# Patient Record
Sex: Female | Born: 1995 | Hispanic: Refuse to answer | Marital: Single | Smoking: Never smoker
Health system: Southern US, Community
[De-identification: ages and names within clinical notes are randomized; demographics above are authoritative.]

---
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
## 2016-02-10 ENCOUNTER — Encounter: Payer: Self-pay | Admitting: Family Medicine

## 2016-02-10 ENCOUNTER — Ambulatory Visit (INDEPENDENT_AMBULATORY_CARE_PROVIDER_SITE_OTHER): Payer: PRIVATE HEALTH INSURANCE | Admitting: Family Medicine

## 2016-02-10 DIAGNOSIS — M545 Low back pain, unspecified: Secondary | ICD-10-CM

## 2016-02-10 DIAGNOSIS — M79604 Pain in right leg: Secondary | ICD-10-CM

## 2016-02-10 MED ORDER — CYCLOBENZAPRINE HCL 5 MG PO TABS: 5.0000 mg | ORAL_TABLET | Freq: Every day | ORAL | 0 refills | Status: AC

## 2016-02-10 NOTE — Progress Notes (Signed)
Patient presents today for follow-up regarding her lower back pain. Patient did see an orthopedic physician when she was home in [place] over the summer. Besides the facet hypertrophy the physician at home commented on a disc bulge that was probably causing her symptoms with the right sided back pain with radiculopathy. She also admits to having a bone scan which was negative. She had 2 epidural injections and did PT while she was home. She did not have much relief with the epidural injections she received. She did see Dr. Ardine Engiehl when she returned back to school and has been seeing the physical therapist that comes to campus. She still admits to having right sided back pain mostly with sitting and radicular symptoms that go down to her right foot but this happens only with impact running for longer than 2-1/2 miles. She does not have the radicular symptoms with ADLs. She currently is only taking Motrin for her pain. She runs about every other day on grass and the track and does back rehabilitation and cross training on the other days. She has not done any lacrosse activity. She states that she still has discomfort with side-to-side movement and rotation that is required with Lacrosse activity. She denies any foot drop, incontinence, fever/chills.  ROS: Negative except mentioned above.  Vitals as per Epic.  GENERAL: NAD RESP: CTA B CARD: RRR MSK: mild right lower lumbar paravertebral tenderness, FROM, -SLR, good hamstring flexibility, slightly decreased strength of RLE compared to LLE, weak glutes, nv intact   NEURO: CN II-XII grossly intact   A/P: R Lower Back Pain with Radicular Symptoms - continue current course of rehabilitation, will prescribe patient Flexeril for nighttime use if needed, patient will continue to take Motrin or Aleve when necessary during the daytime. Will follow up with me in one month for interval change, sooner if any new problems.

## 2016-03-21 ENCOUNTER — Other Ambulatory Visit: Payer: Self-pay | Admitting: Family Medicine

## 2016-03-27 ENCOUNTER — Encounter: Payer: Self-pay | Admitting: Family Medicine

## 2016-03-27 ENCOUNTER — Ambulatory Visit (INDEPENDENT_AMBULATORY_CARE_PROVIDER_SITE_OTHER): Payer: PRIVATE HEALTH INSURANCE | Admitting: Family Medicine

## 2016-03-27 DIAGNOSIS — M5416 Radiculopathy, lumbar region: Secondary | ICD-10-CM

## 2016-03-27 DIAGNOSIS — M5417 Radiculopathy, lumbosacral region: Secondary | ICD-10-CM

## 2016-03-30 ENCOUNTER — Other Ambulatory Visit: Payer: Self-pay | Admitting: Family Medicine

## 2016-03-30 DIAGNOSIS — M5410 Radiculopathy, site unspecified: Secondary | ICD-10-CM

## 2016-05-15 NOTE — Progress Notes (Signed)
Patient presents today for follow-up regarding her lower back pain and right lower extremity radicular pain. Patient states that her symptoms have improved have not resolved. She states that she is able to exercise however certain motions such as twisting and sprinting do cause discomfort. She is able to run approximately 2 miles without having any discomfort. She denies any foot drop, incontinence, fever/chills. She has been working with her Event organiserathletic trainer on core strengthening and hamstring flexibility and has also seen PT. She did have injections over the summer when she was home in [place]. I'm still unclear as to whether she had epidural injections or facet injections. I have not seen paperwork related to these procedures. She has had imaging which has not shown any significant disc pathology. She has an order today from a provider back home for an EMG. She has seen Dr. Ardine Engiehl as well who suggested to continue rehabilitation for now. She has not attempted to do any Lacrosse activity at this point.  ROS: Negative except mentioned above. Vitals as per Epic.  GENERAL: NAD RESP: CTA B CARD: RRR MSK: No midline tenderness of the back, mild generalized paravertebral tenderness of the lower lumbar area, reproduced with extension, normal flexion, full range of motion of the back, positive straight leg raise, negative foot drop, normal heel and toe walk, 5 out of 5 strength of lower extremities, NV intact NEURO: CN II-XII grossly intact   A/P: Lower back pain with radicular symptoms - unsure as to the etiology given imaging and unsuccessful treatment that she has had previously. I would like her to see what limitation she has with practicing with Lacrosse. Patient is okay to attempt this and will be monitored by the athletic trainer. If any symptoms persist or worsen she is to stop the attempt at practicing. Modification in the weight room as she has been doing. Unsure whether EMG will be of any help with  diagnosis but will proceed and getting this procedure. Will also speak with Dr. Ardine Engiehl about [facility]. Patient is fine with the plan and all questions were answered.

## 2016-08-16 ENCOUNTER — Ambulatory Visit (INDEPENDENT_AMBULATORY_CARE_PROVIDER_SITE_OTHER): Payer: PRIVATE HEALTH INSURANCE | Admitting: Family Medicine

## 2016-08-16 ENCOUNTER — Encounter: Payer: Self-pay | Admitting: Family Medicine

## 2016-08-16 VITALS — BP 106/61 | HR 71 | Temp 98.2°F | Resp 14

## 2016-08-16 DIAGNOSIS — R1084 Generalized abdominal pain: Secondary | ICD-10-CM

## 2016-08-16 NOTE — Progress Notes (Signed)
Patient presents today with generalized abdominal pain. She denies any injury or trauma to the area. She denies any diarrhea, vomiting or fever. She has no upper respiratory symptoms. She has had symptoms for the last few days intermittently. She recently finished her menstrual cycle. She denies any vaginal discharge or any urinary symptoms. She does admit that she has hard stool and usually has a bowel movement only every for 5 days. She states that this has been going on for a long time. She denies any melena or bright red blood per rectum. She denies any family history of inflammatory bowel disease. She denies any known history of gluten sensitivity. She states her diet is mostly bland and healthy. She denies any excessive caffeine. At times she does state that she has reflux symptoms. She denies eating or drinking much dairy. She does take a fiber supplement. She admits to usually eating salads and wheat bread.   ROS: Negative except mentioned above. Vitals as per Epic.  GENERAL: NAD HEENT: no pharyngeal erythema, no exudate, no erythema of TMs, no cervical LAD RESP: CTA B CARD: RRR ABD: Mildly decreased bowel sounds, mild discomfort right lower quadrant, epigastric, and left lower quadrant, no rebound or guarding, no flank tenderness, no suprapubic tenderness NEURO: CN II-XII grossly intact   Urine Dip : Negative leukocytes, negative nitrites, 1+ protein, negative blood, trace ketones, negative glucose, specific gravity 1.005, pH 8.0, negative urine pregnancy  A/P: Generalized abdominal pain - no acute pain at this time, vitals stable, discussed with patient that her symptoms sound suggestive of constipation, would suggest using magnesium citrate once or twice only and then starting to take a stool softener daily, increase water intake,  start eating 2-3 prunes a day. If symptoms do persist or worsen seek medical attention. Patient addresses understanding.

## 2017-09-03 IMAGING — CT CT L SPINE W/O CM
3 of 5 series · 14 of 33 positions shown, 17 images · non-contrast
Comparison: Lumbar MRI 09/13/2015. Lumbar spine radiographs
08/30/2015.

CLINICAL DATA: 20-year-old female lacrosse player with persistent
back pain not responding to conservative management. Lumbar back
pain made worse with extension motion. Radicular symptoms somewhat
greater on the left side. Subsequent encounter.

EXAM:
CT LUMBAR SPINE WITHOUT CONTRAST - LIMITED
TECHNIQUE: Multidetector CT imaging of the lumbar spine was performed without
intravenous contrast administration. Multiplanar CT image
reconstructions were also generated.

[Series 4: l spine soft · axial · 0.22mm/px · z∈[-893,-823]mm · 6 of 46 slices shown, 8 images]
[im 6/46  soft-tissue]
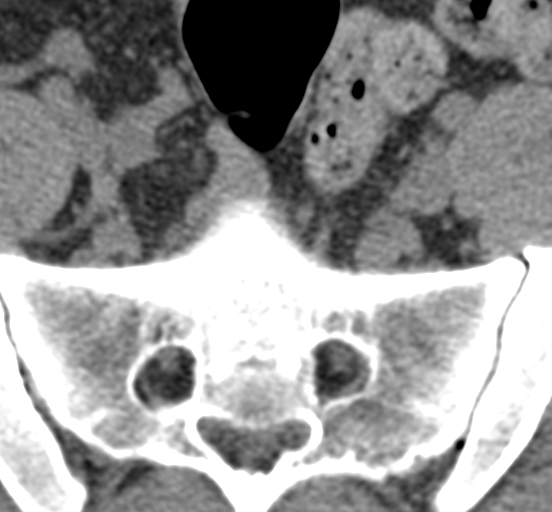
[im 6/46  bone]
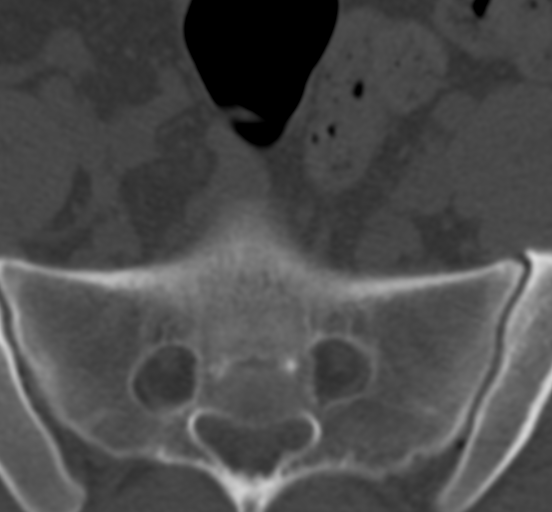
[im 16/46  bone]
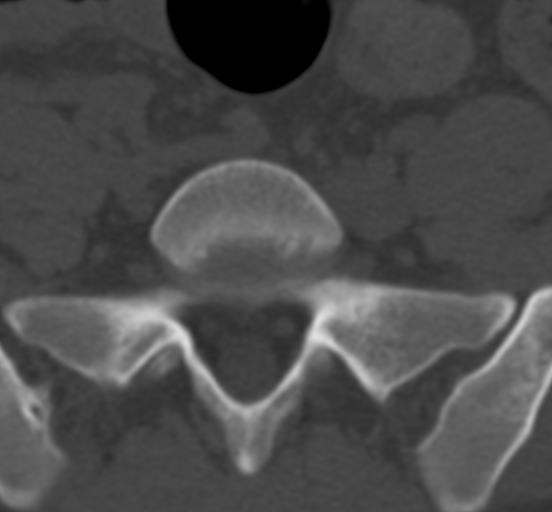
[im 21/46  bone]
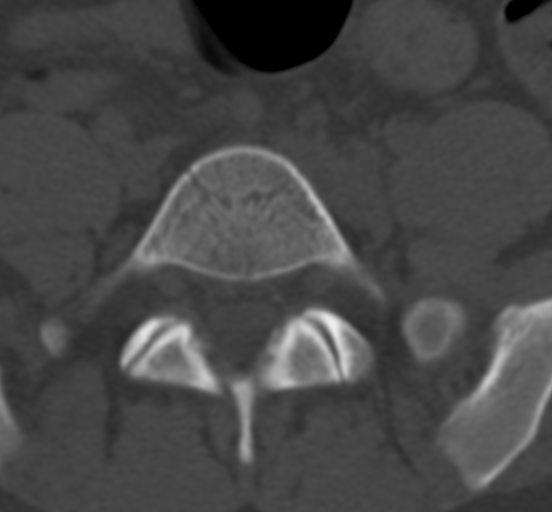
[im 26/46  bone]
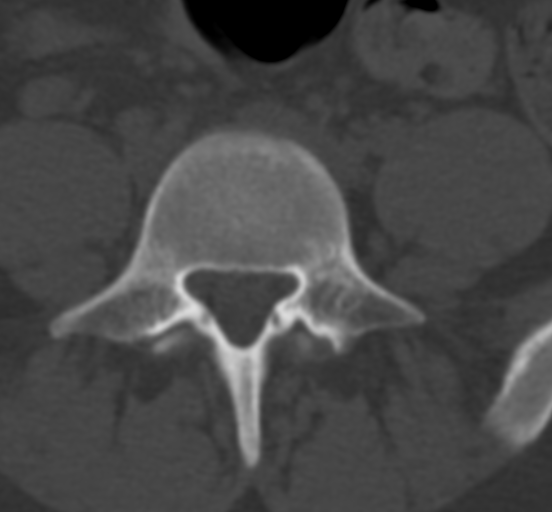
[im 36/46  soft-tissue]
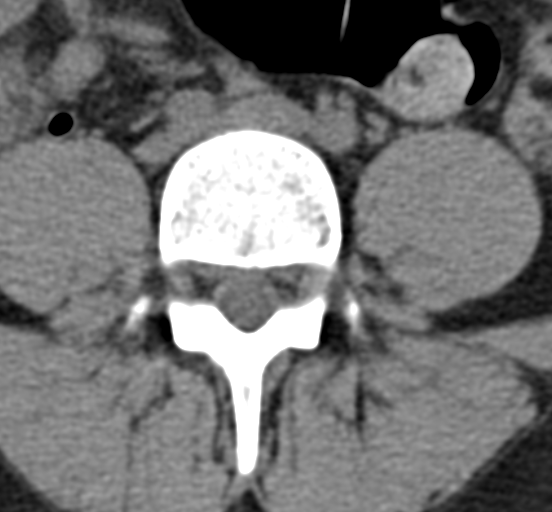
[im 36/46  bone]
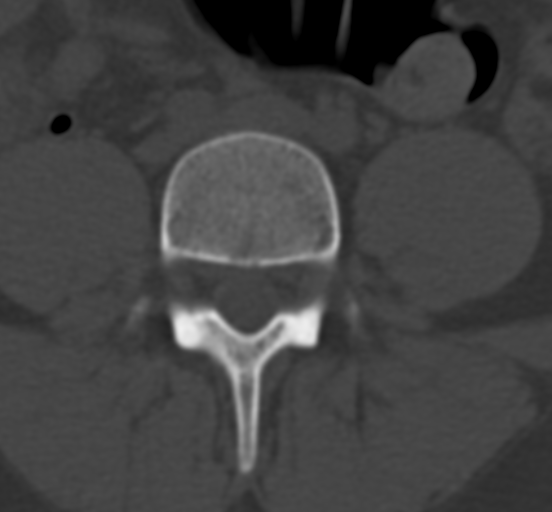
[im 41/46  bone]
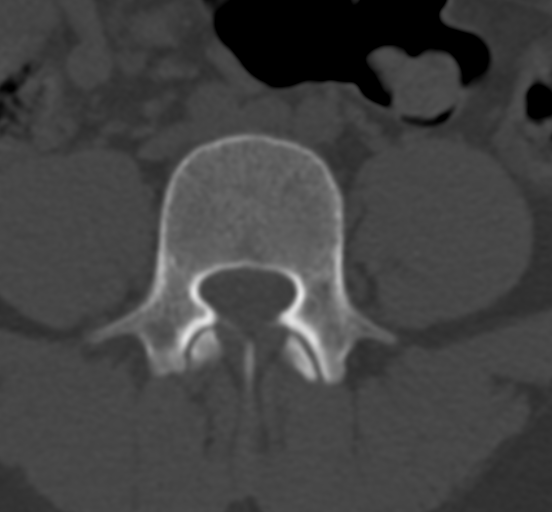

[Series 8: sagittal soft tissue · sagittal · 0.23mm/px · 5 of 62 slices shown, 6 images]
[im 21/62  bone]
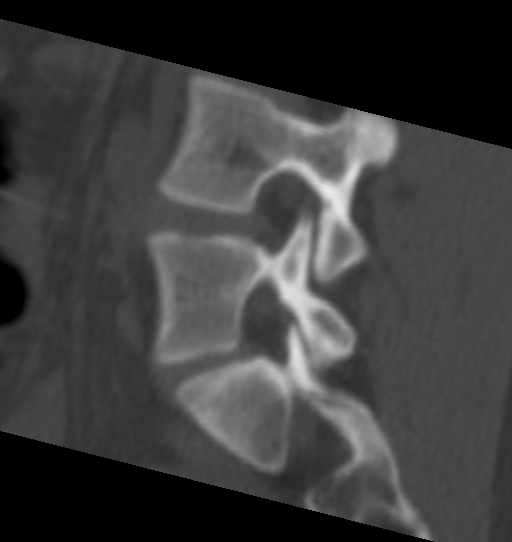
[im 26/62  bone]
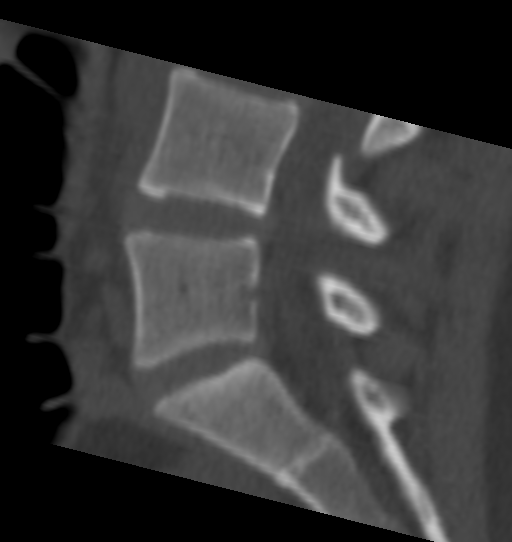
[im 31/62  soft-tissue]
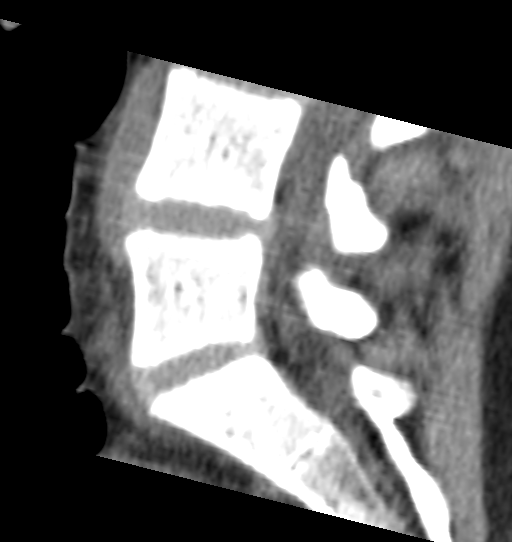
[im 31/62  bone]
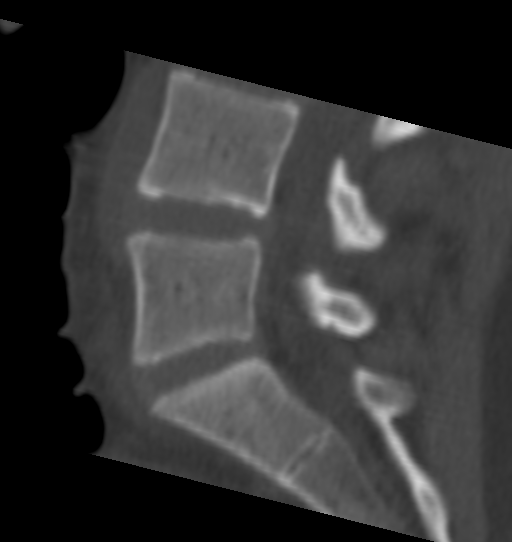
[im 36/62  bone]
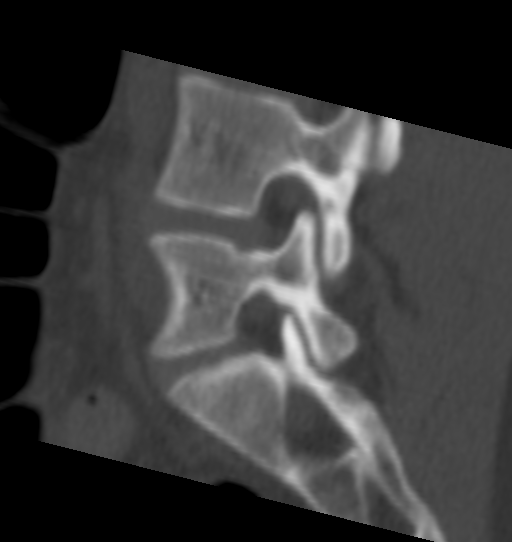
[im 41/62  bone]
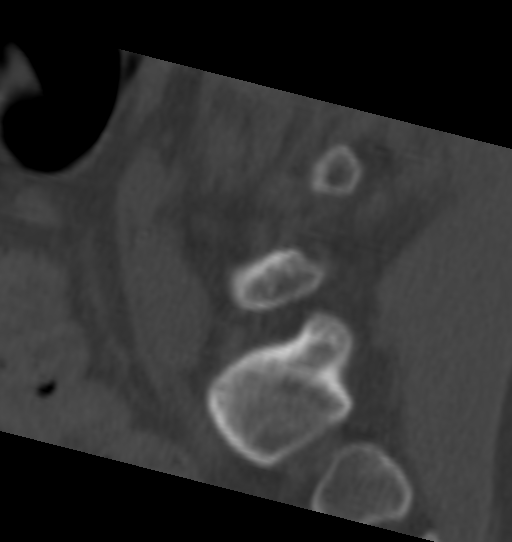

[Series 9: coronal soft tissue · coronal · 0.21mm/px · 3 of 57 slices shown]
[im 12/57  bone]
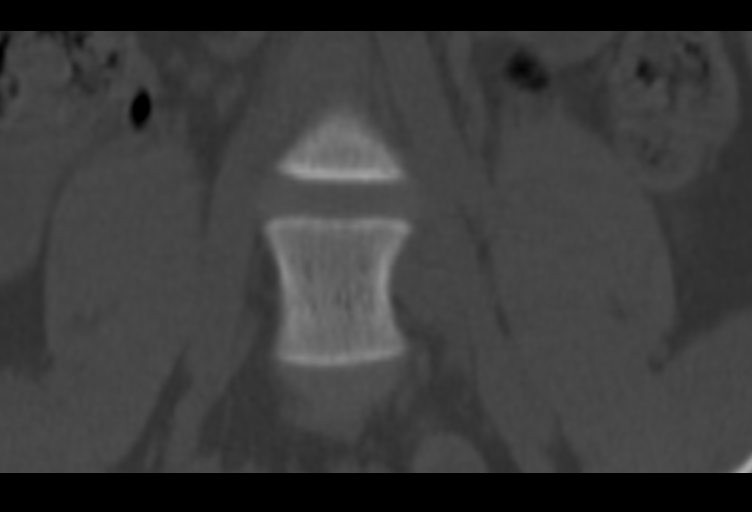
[im 23/57  bone]
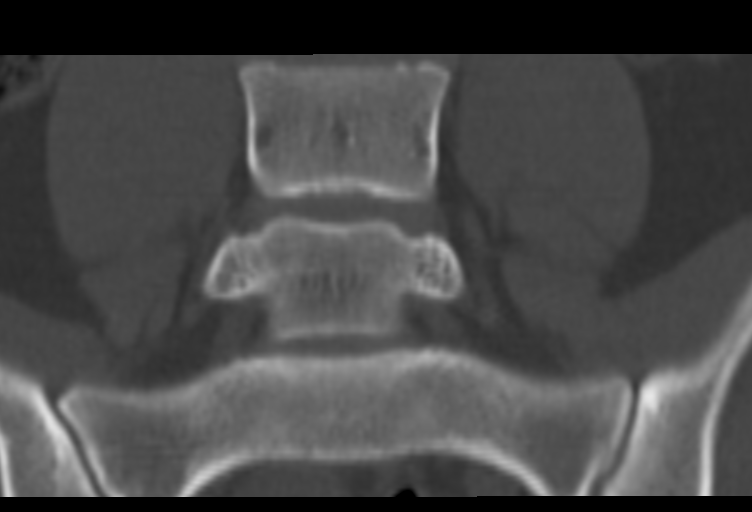
[im 34/57  bone]
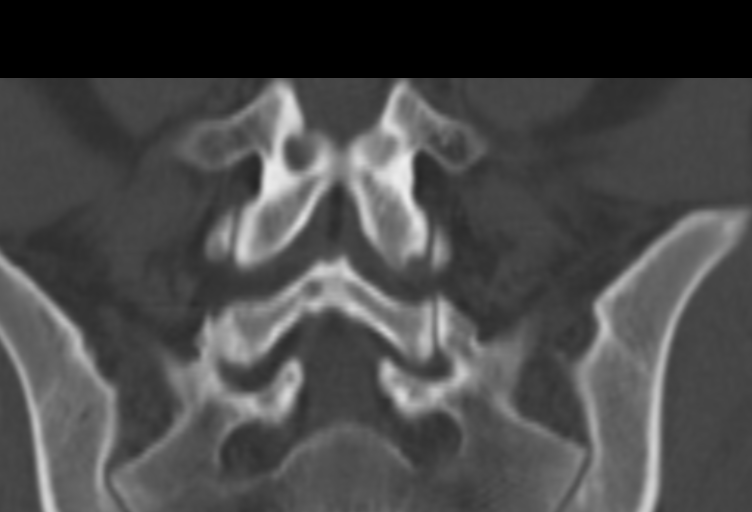

[14 of 33 positions shown; findings below may reference images not displayed]

FINDINGS: The same numbering system is used as on the comparison MRI,
designating the lowest lumbarized level L5. By this designation
there are absent or hypoplastic ribs at T12 and full size ribs at
T11.

After discussion with Dr. JASBIR POHL , decision was made to
perform limited noncontrast CT of the lumbar spine to include the
L4-L5 and L5-S1 levels.

Negative visualized noncontrast abdominal and pelvic viscera,
including the appendix.

Visualized lower lumbar paraspinal soft tissues appear normal.

Stable and normal lower lumbar vertebral height and alignment. Bone
mineralization is within normal limits. Bilateral L4 and L5
posterior elements appear intact. No asymmetric sclerosis or
evidence of posterior element stress reaction. There is perhaps mild
bilateral facet hypertrophy at both levels (series 2, images 19 at
L4-L5 and image 28 at L5-S1). Visible sacrum is intact. Visible SI
joints appear normal.

No CT evidence of disc herniation or spinal stenosis. Bilateral L4
and L5 neural foramina appear normal.
IMPRESSION: 1. Negative for acute osseous abnormality, pars fracture, or CT
evidence of bony stress reaction at L4-L5 or L5-S1.
2. Borderline to mild bilateral L4-L5 and L5-S1 facet hypertrophy.
